# Patient Record
Sex: Male | Born: 1960 | Race: White | Hispanic: No | Marital: Single | Smoking: Never smoker
Health system: Southern US, Community
[De-identification: ages and names within clinical notes are randomized; demographics above are authoritative.]

## PROBLEM LIST (undated history)

## (undated) HISTORY — PX: TONSILLECTOMY: SUR1361

## (undated) HISTORY — PX: ANKLE SURGERY: SHX546

## (undated) HISTORY — PX: KNEE SURGERY: SHX244

---

## 2017-09-09 ENCOUNTER — Emergency Department (HOSPITAL_BASED_OUTPATIENT_CLINIC_OR_DEPARTMENT_OTHER)
Admission: EM | Admit: 2017-09-09 | Discharge: 2017-09-09 | Disposition: A | Discharge status: Home / Self Care | Payer: Non-veteran care | Attending: Emergency Medicine | Admitting: Emergency Medicine

## 2017-09-09 ENCOUNTER — Encounter (HOSPITAL_BASED_OUTPATIENT_CLINIC_OR_DEPARTMENT_OTHER): Payer: Self-pay | Admitting: *Deleted

## 2017-09-09 ENCOUNTER — Other Ambulatory Visit: Payer: Self-pay

## 2017-09-09 DIAGNOSIS — S0181XA Laceration without foreign body of other part of head, initial encounter: Secondary | ICD-10-CM | POA: Diagnosis present

## 2017-09-09 DIAGNOSIS — Y9389 Activity, other specified: Secondary | ICD-10-CM | POA: Diagnosis not present

## 2017-09-09 DIAGNOSIS — Y998 Other external cause status: Secondary | ICD-10-CM | POA: Diagnosis not present

## 2017-09-09 DIAGNOSIS — Y929 Unspecified place or not applicable: Secondary | ICD-10-CM | POA: Diagnosis not present

## 2017-09-09 DIAGNOSIS — Z23 Encounter for immunization: Secondary | ICD-10-CM | POA: Insufficient documentation

## 2017-09-09 DIAGNOSIS — W268XXA Contact with other sharp object(s), not elsewhere classified, initial encounter: Secondary | ICD-10-CM | POA: Diagnosis not present

## 2017-09-09 MED ORDER — LIDOCAINE HCL (PF) 1 % IJ SOLN
10.0000 mL | Freq: Once | INTRAMUSCULAR | Status: DC
  Filled 2017-09-09: qty 10

## 2017-09-09 MED ORDER — TETANUS-DIPHTH-ACELL PERTUSSIS 5-2.5-18.5 LF-MCG/0.5 IM SUSP
0.5000 mL | Freq: Once | INTRAMUSCULAR | Status: AC
Start: 2017-09-09 — End: 2017-09-09
  Administered 2017-09-09: 0.5 mL via INTRAMUSCULAR
  Filled 2017-09-09: qty 0.5

## 2017-09-09 MED ORDER — HYDROCODONE-ACETAMINOPHEN 5-325 MG PO TABS: 1.0000 | ORAL_TABLET | Freq: Four times a day (QID) | ORAL | 0 refills | Status: AC | PRN

## 2017-09-09 MED ORDER — BACITRACIN ZINC 500 UNIT/GM EX OINT
TOPICAL_OINTMENT | Freq: Once | CUTANEOUS | Status: AC
  Administered 2017-09-09: 23:00:00 via TOPICAL

## 2017-09-09 NOTE — Discharge Instructions (Signed)
Keep the wound clean and dry for the first 24 hours. After that you may gently clean the wound with soap and water. Make sure to pat dry the wound before covering it with any dressing. You can use topical antibiotic ointment and bandage. Ice and elevate for pain relief.   You can take Tylenol or Ibuprofen as directed for pain. You can alternate Tylenol and Ibuprofen every 4 hours for additional pain relief. You can take pain medication for severe or breakthrough.   Return to the Emergency Department, your primary care doctor, or the [facility] in 5-7 days for suture removal.   Monitor closely for any signs of infection. Return to the Emergency Department for any worsening redness/swelling of the area that begins to spread, drainage from the site, worsening pain, fever or any other worsening or concerning symptoms.

## 2017-09-09 NOTE — ED Triage Notes (Signed)
Laceration to the left side of his face near his eye. He turned and hit his face on his rear view mirror. Bleeding controlled.

## 2017-09-09 NOTE — ED Notes (Signed)
1 inch lac to left side of face on upper cheek bone from a broken mirror. Minimal bleeding noted.

## 2017-09-09 NOTE — ED Provider Notes (Signed)
[facility] EMERGENCY DEPARTMENT Provider Note   CSN: [id number] Arrival date & time: 09/09/17  2019     History   Chief Complaint Chief Complaint  Patient presents with  . Laceration    HPI Frank Cobb is a 57 y.o. male who presents for evaluation of left-sided facial injury that occurred this evening.  Patient reports that he was bending down to pick something up in his car door and when he stood up, he hit the left side of his face on the car window.  He states that there was a sharp edge that caused a laceration.  Patient reports that bleeding is controlled on ED arrival.  He states he did not have any LOC or head injury.  Patient states he thinks his Tdap is within 5 years but is not sure.  The history is provided by the patient.    History reviewed. No pertinent past medical history.  There are no active problems to display for this patient.   Past Surgical History:  Procedure Laterality Date  . ANKLE SURGERY    . KNEE SURGERY    . TONSILLECTOMY          Home Medications    Prior to Admission medications   Medication Sig Start Date End Date Taking? Authorizing Provider  HYDROcodone-acetaminophen (NORCO/VICODIN) 5-325 MG tablet Take 1-2 tablets by mouth every 6 (six) hours as needed. 09/09/17   Maxwell Caul, PA-C    Family History No family history on file.  Social History Social History   Tobacco Use  . Smoking status: Never Smoker  . Smokeless tobacco: Never Used  Substance Use Topics  . Alcohol use: Never    Frequency: Never  . Drug use: Never     Allergies   Patient has no known allergies.   Review of Systems Review of Systems  Skin: Positive for wound.     Physical Exam Updated Vital Signs BP 122/83   Pulse 89   Temp 98.1 F (36.7 C) (Oral)   Resp 18   Ht 5\' 6"  (1.676 m)   Wt 70.3 kg (155 lb)   SpO2 99%   BMI 25.02 kg/m   Physical Exam  Constitutional: He appears well-developed and well-nourished.  HENT:    Head: Normocephalic and atraumatic.    Eyes: Conjunctivae and EOM are normal. Right eye exhibits no discharge. Left eye exhibits no discharge. No scleral icterus.  Pulmonary/Chest: Effort normal.  Neurological: He is alert.  Skin: Skin is warm and dry.  Psychiatric: He has a normal mood and affect. His speech is normal and behavior is normal.  Nursing note and vitals reviewed.    ED Treatments / Results  Labs (all labs ordered are listed, but only abnormal results are displayed) Labs Reviewed - No data to display  EKG None  Radiology No results found.  Procedures .Marland KitchenLaceration Repair Date/Time: 09/09/2017 11:07 PM Performed by: Maxwell Caul, PA-C Authorized by: Maxwell Caul, PA-C   Consent:    Consent obtained:  Verbal   Consent given by:  Patient   Risks discussed:  Infection, pain, retained foreign body and poor cosmetic result Anesthesia (see MAR for exact dosages):    Anesthesia method:  Local infiltration   Local anesthetic:  Lidocaine 2% WITH epi Laceration details:    Location:  Face   Length (cm):  2.5 Repair type:    Repair type:  Simple Exploration:    Wound exploration: wound explored through full range of motion  Wound extent: no foreign bodies/material noted     Contaminated: no   Treatment:    Area cleansed with:  Betadine and saline   Amount of cleaning:  Extensive   Irrigation solution:  Sterile saline   Irrigation method:  Syringe   Visualized foreign bodies/material removed: no   Skin repair:    Repair method:  Sutures   Suture size:  6-0   Suture material:  Prolene   Suture technique:  Simple interrupted   Number of sutures:  7 Approximation:    Approximation:  Close Post-procedure details:    Dressing:  Antibiotic ointment and sterile dressing Comments:     Once the wound was properly anesthetized, it was explored with full range of motion.  The wound appeared deeper than initially thought.  Given the extensiveness,  sutures were chosen.  The wound was thoroughly and excessively irrigated with sterile saline.  Wound repaired as documented above.   (including critical care time)  Medications Ordered in ED Medications  bacitracin ointment (has no administration in time range)  Tdap (BOOSTRIX) injection 0.5 mL (0.5 mLs Intramuscular Given 09/09/17 2211)     Initial Impression / Assessment and Plan / ED Course  I have reviewed the triage vital signs and the nursing notes.  Pertinent labs & imaging results that were available during my care of the patient were reviewed by me and considered in my medical decision making (see chart for details).     57 year old male who presents for left-sided facial laceration that occurred this evening.  Hit the left side of face on a sharp mirror.  No LOC, head injury.  Unsure of Tdap status. Patient is afebrile, non-toxic appearing, sitting comfortably on examination table. Vital signs reviewed and stable.  On exam, patient is a 1 inch linear laceration to the left side of face.  On further evaluation, the wound appears deeper than initially evaluation.  Given extensiveness, will plan for sutures instead of glue.  Laceration repaired as documented above.  Patient tolerated procedure well.  Wound care instructions discussed with patient. Patient had ample opportunity for questions and discussion. All patient's questions were answered with full understanding. Strict return precautions discussed. Patient expresses understanding and agreement to plan.   Final Clinical Impressions(s) / ED Diagnoses   Final diagnoses:  Facial laceration, initial encounter    ED Discharge Orders        Ordered    HYDROcodone-acetaminophen (NORCO/VICODIN) 5-325 MG tablet  Every 6 hours PRN     09/09/17 2306       Maxwell CaulLayden, Anaalicia Reimann A, PA-C 09/09/17 2310    Rolland PorterJames, Mark, MD 09/16/17 0005

## 2017-09-17 ENCOUNTER — Encounter (HOSPITAL_BASED_OUTPATIENT_CLINIC_OR_DEPARTMENT_OTHER): Payer: Self-pay

## 2017-09-17 ENCOUNTER — Emergency Department (HOSPITAL_BASED_OUTPATIENT_CLINIC_OR_DEPARTMENT_OTHER)
Admission: EM | Admit: 2017-09-17 | Discharge: 2017-09-17 | Disposition: A | Discharge status: Home / Self Care | Payer: Non-veteran care | Attending: Emergency Medicine | Admitting: Emergency Medicine

## 2017-09-17 ENCOUNTER — Other Ambulatory Visit: Payer: Self-pay

## 2017-09-17 DIAGNOSIS — Z4802 Encounter for removal of sutures: Secondary | ICD-10-CM | POA: Diagnosis present

## 2017-09-17 NOTE — ED Provider Notes (Signed)
[facility] EMERGENCY DEPARTMENT Provider Note   CSN: [id number] Arrival date & time: 09/17/17  1836     History   Chief Complaint Chief Complaint  Patient presents with  . Suture / Staple Removal    HPI Frank Cobb is a 57 y.o. male.  HPI  Patient is a [age] year old male with no significant past medical history presenting for suture removal.  Patient reports that on March 26, he scraped his head on the side of a broken mirror on his truck, and obtain laceration.  Patient denies any drainage or erythema from the sutured site over this past week.  History reviewed. No pertinent past medical history.  There are no active problems to display for this patient.   Past Surgical History:  Procedure Laterality Date  . ANKLE SURGERY    . KNEE SURGERY    . TONSILLECTOMY          Home Medications    Prior to Admission medications   Medication Sig Start Date End Date Taking? Authorizing Provider  HYDROcodone-acetaminophen (NORCO/VICODIN) 5-325 MG tablet Take 1-2 tablets by mouth every 6 (six) hours as needed. 09/09/17   Maxwell CaulLayden, Lindsey A, PA-C    Family History No family history on file.  Social History Social History   Tobacco Use  . Smoking status: Never Smoker  . Smokeless tobacco: Never Used  Substance Use Topics  . Alcohol use: Never    Frequency: Never  . Drug use: Never     Allergies   Decadron [dexamethasone]   Review of Systems Review of Systems  Skin: Negative for color change and wound.  Neurological: Negative for headaches.     Physical Exam Updated Vital Signs BP 103/67 (BP Location: Left Arm)   Pulse 84   Temp 98.3 F (36.8 C) (Oral)   Resp 16   Ht 5\' 6"  (1.676 m)   Wt 70.3 kg (155 lb)   SpO2 98%   BMI 25.02 kg/m   Physical Exam  Constitutional: He appears well-developed and well-nourished. No distress.  Sitting comfortably in bed.  HENT:  Head: Normocephalic and atraumatic.  Eyes: Conjunctivae are normal. Right eye  exhibits no discharge. Left eye exhibits no discharge.  EOMs normal to gross examination.  Neck: Normal range of motion.  Cardiovascular: Normal rate and regular rhythm.  Intact, 2+ radial pulse.  Pulmonary/Chest:  Normal respiratory effort. Patient converses comfortably. No audible wheeze or stridor.  Abdominal: He exhibits no distension.  Musculoskeletal: Normal range of motion.  Neurological: He is alert.  Cranial nerves intact to gross observation. Patient moves extremities without difficulty.  Skin: Skin is warm and dry. He is not diaphoretic.  Patient exhibits a well-healed laceration of the left temple.  No erythema.  No hypertrophy of skin.  Psychiatric: He has a normal mood and affect. His behavior is normal. Judgment and thought content normal.  Nursing note and vitals reviewed.    ED Treatments / Results  Labs (all labs ordered are listed, but only abnormal results are displayed) Labs Reviewed - No data to display  EKG None  Radiology No results found.  Procedures Procedures (including critical care time)  Medications Ordered in ED Medications - No data to display   Initial Impression / Assessment and Plan / ED Course  I have reviewed the triage vital signs and the nursing notes.  Pertinent labs & imaging results that were available during my care of the patient were reviewed by me and considered in my medical decision making (  see chart for details).     Patient well-appearing in no acute distress.  7 Prolene sutures removed from the face today.  Laceration is well-healed.  Patient instructed to return for any new or worsening symptoms.  Final Clinical Impressions(s) / ED Diagnoses   Final diagnoses:  Visit for suture removal    ED Discharge Orders    None       Delia Chimes 09/17/17 2044    Melene Plan, DO 09/17/17 2110

## 2017-09-17 NOTE — Discharge Instructions (Signed)
Please see the information and instructions below regarding your visit.  Your diagnoses today include:  1. Visit for suture removal     Tests performed today include: See side panel of your discharge paperwork for testing performed today. Vital signs are listed at the bottom of these instructions.   Medications prescribed:    Take any prescribed medications only as prescribed, and any over the counter medications only as directed on the packaging.  Home care instructions:  Please follow any educational materials contained in this packet.   You may continue to keep the area covered if there will be dirt in it.  Follow-up instructions: Please follow-up with your primary care provider as needed for further evaluation of your symptoms if they are not completely improved.    Return instructions:  Please return to the Emergency Department if you experience worsening symptoms.  Please return if you have any other emergent concerns.  Additional Information:   Your vital signs today were: BP 103/67 (BP Location: Left Arm)    Pulse 84    Temp 98.3 F (36.8 C) (Oral)    Resp 16    Ht 5\' 6"  (1.676 m)    Wt 70.3 kg (155 lb)    SpO2 98%    BMI 25.02 kg/m  If your blood pressure (BP) was elevated on multiple readings during this visit above 130 for the top number or above 80 for the bottom number, please have this repeated by your primary care provider within one month. --------------  Thank you for allowing us to participate in your care today.

## 2017-09-17 NOTE — ED Triage Notes (Signed)
Pt here to get stitches removed from left side of face

## 2017-10-30 ENCOUNTER — Emergency Department (HOSPITAL_BASED_OUTPATIENT_CLINIC_OR_DEPARTMENT_OTHER)
Admission: EM | Admit: 2017-10-30 | Discharge: 2017-10-30 | Disposition: A | Discharge status: Home / Self Care | Payer: Non-veteran care | Attending: Physician Assistant | Admitting: Physician Assistant

## 2017-10-30 ENCOUNTER — Other Ambulatory Visit: Payer: Self-pay

## 2017-10-30 ENCOUNTER — Encounter (HOSPITAL_BASED_OUTPATIENT_CLINIC_OR_DEPARTMENT_OTHER): Payer: Self-pay | Admitting: Adult Health

## 2017-10-30 ENCOUNTER — Emergency Department (HOSPITAL_BASED_OUTPATIENT_CLINIC_OR_DEPARTMENT_OTHER): Payer: Non-veteran care

## 2017-10-30 DIAGNOSIS — S61210A Laceration without foreign body of right index finger without damage to nail, initial encounter: Secondary | ICD-10-CM | POA: Insufficient documentation

## 2017-10-30 DIAGNOSIS — S6981XA Other specified injuries of right wrist, hand and finger(s), initial encounter: Secondary | ICD-10-CM | POA: Diagnosis present

## 2017-10-30 DIAGNOSIS — W312XXA Contact with powered woodworking and forming machines, initial encounter: Secondary | ICD-10-CM | POA: Diagnosis not present

## 2017-10-30 DIAGNOSIS — Y999 Unspecified external cause status: Secondary | ICD-10-CM | POA: Insufficient documentation

## 2017-10-30 DIAGNOSIS — Y929 Unspecified place or not applicable: Secondary | ICD-10-CM | POA: Diagnosis not present

## 2017-10-30 DIAGNOSIS — Y939 Activity, unspecified: Secondary | ICD-10-CM | POA: Insufficient documentation

## 2017-10-30 MED ORDER — CEPHALEXIN 500 MG PO CAPS: 500.0000 mg | ORAL_CAPSULE | Freq: Two times a day (BID) | ORAL | 0 refills | Status: AC

## 2017-10-30 MED ORDER — BACITRACIN ZINC 500 UNIT/GM EX OINT
TOPICAL_OINTMENT | Freq: Two times a day (BID) | CUTANEOUS | Status: DC
  Administered 2017-10-30: 1 via TOPICAL
  Filled 2017-10-30: qty 28.35

## 2017-10-30 NOTE — Discharge Instructions (Addendum)
Take antibiotics as prescribed.  Take the entire course, even if the symptoms improve. Keep the wound clean and covered.  Wash it daily with soap and water.  Reapply new dressing each day. Follow-up with your primary care doctor as needed for further concerns. Return to the emergency room if you develop high fevers, pus draining from the area, inability to move your finger, numbness, or any new or concerning symptoms.

## 2017-10-30 NOTE — ED Notes (Signed)
Right Index finger lac irrigated w/ sterile saline. Bleeding controlled. Pt soaking finger in iodine/sterile saline solution.

## 2017-10-30 NOTE — ED Provider Notes (Signed)
[facility] EMERGENCY DEPARTMENT Provider Note   CSN: [id number] Arrival date & time: 10/30/17  1939     History   Chief Complaint Chief Complaint  Patient presents with  . Laceration    HPI Frank Cobb is a 57 y.o. male presenting for evaluation of finger laceration.  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  He cleaned the laceration with peroxide and iodine, bandaged it, and an hour later repeated the same.  He is here to make sure that he did not break the bone.  He denies numbness or tingling.  He is able to move his finger.  His tetanus is up-to-date.  He is not immunocompromised.  Is not on blood thinners.  HPI  History reviewed. No pertinent past medical history.  There are no active problems to display for this patient.   Past Surgical History:  Procedure Laterality Date  . ANKLE SURGERY    . KNEE SURGERY    . TONSILLECTOMY          Home Medications    Prior to Admission medications   Medication Sig Start Date End Date Taking? Authorizing Provider  cephALEXin (KEFLEX) 500 MG capsule Take 1 capsule (500 mg total) by mouth 2 (two) times daily for 5 days. 10/30/17 11/04/17  Charlene Detter, PA-C  HYDROcodone-acetaminophen (NORCO/VICODIN) 5-325 MG tablet Take 1-2 tablets by mouth every 6 (six) hours as needed. 09/09/17   Maxwell Caul, PA-C    Family History History reviewed. No pertinent family history.  Social History Social History   Tobacco Use  . Smoking status: Never Smoker  . Smokeless tobacco: Never Used  Substance Use Topics  . Alcohol use: Never    Frequency: Never  . Drug use: Never     Allergies   Decadron [dexamethasone]   Review of Systems Review of Systems  Skin: Positive for wound.  Hematological: Does not bruise/bleed easily.     Physical Exam Updated Vital Signs BP 112/76 (BP Location: Left Arm)   Pulse 90   Temp 98.2 F  (36.8 C) (Oral)   Resp 16   SpO2 98%   Physical Exam  Constitutional: He is oriented to person, place, and time. He appears well-developed and well-nourished. No distress.  HENT:  Head: Normocephalic and atraumatic.  Eyes: EOM are normal.  Neck: Normal range of motion.  Pulmonary/Chest: Effort normal.  Abdominal: He exhibits no distension.  Musculoskeletal: Normal range of motion. He exhibits tenderness. He exhibits no edema.  Radial pulses intact bilaterally.  Sensation of all fingers intact.  Strength of index finger against resistance intact.  Good cap refill.  Neurological: He is alert and oriented to person, place, and time. No sensory deficit.  Skin: Skin is warm. No rash noted.  Jagged superficial laceration approximately 1 cm long of the distal aspect of the right index finger without active bleeding.  Psychiatric: He has a normal mood and affect.  Nursing note and vitals reviewed.    ED Treatments / Results  Labs (all labs ordered are listed, but only abnormal results are displayed) Labs Reviewed - No data to display  EKG None  Radiology Dg Finger Index Right  Result Date: 10/30/2017 CLINICAL DATA:  Right index finger laceration EXAM: RIGHT INDEX FINGER 2+V COMPARISON:  None. FINDINGS: No acute displaced fracture or malalignment. Dorsal soft tissue injury distal second digit. Mild degenerative changes at the DIP joint.  No radiopaque foreign body. IMPRESSION: No acute osseous abnormality. Electronically Signed   By: Jasmine Pang M.D.   On: 10/30/2017 20:52    Procedures Procedures (including critical care time)  Medications Ordered in ED Medications  bacitracin ointment (1 application Topical Given 10/30/17 2113)     Initial Impression / Assessment and Plan / ED Course  I have reviewed the triage vital signs and the nursing notes.  Pertinent labs & imaging results that were available during my care of the patient were reviewed by me and considered in my  medical decision making (see chart for details).     Pt presenting for evaluation of laceration on the dorsal aspect of the right index finger.  Physical exam shows patient is neurovascularly intact.  Concerned about bony involvement, will obtain x-ray.  X-ray reviewed and interpreted by me, no fracture or dislocation.  Discussed findings with patient.  As wound is very jagged, superficial, and without active bleeding, will apply bacitracin and nonstick dressing.  Discussed wound care.  Will discharge with antibiotics for prophylaxis.  At this time, patient appears safe for discharge.  Return precautions given.  Patient states he understands and agrees to plan.   Final Clinical Impressions(s) / ED Diagnoses   Final diagnoses:  Laceration of right index finger without foreign body without damage to nail, initial encounter    ED Discharge Orders        Ordered    cephALEXin (KEFLEX) 500 MG capsule  2 times daily     10/30/17 2058       Alveria Apley, PA-C 10/31/17 0002    Mackuen, Cindee Salt, MD 10/31/17 0007

## 2017-10-30 NOTE — ED Triage Notes (Signed)
Presents with LAceration to right pointer finger that occurred from a table saw today. Bleeding c9ontrolled.

## 2017-10-30 NOTE — ED Notes (Signed)
Suture cart at bedside 

## 2019-05-07 IMAGING — CR DG FINGER INDEX 2+V*R*
3 series · 3 of 3 positions shown · non-contrast
Comparison: None.

CLINICAL DATA: Right index finger laceration

EXAM:
RIGHT INDEX FINGER 2+V

[x finger pa right]
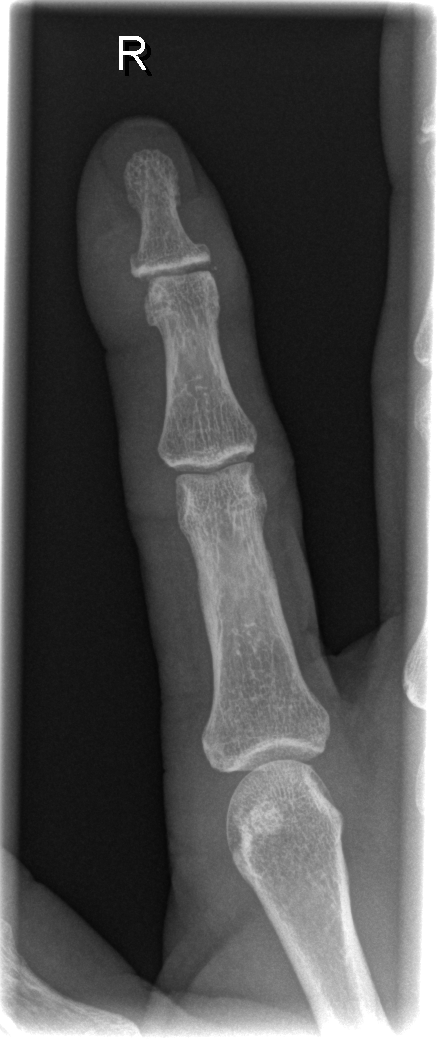

[x finger obl. right]
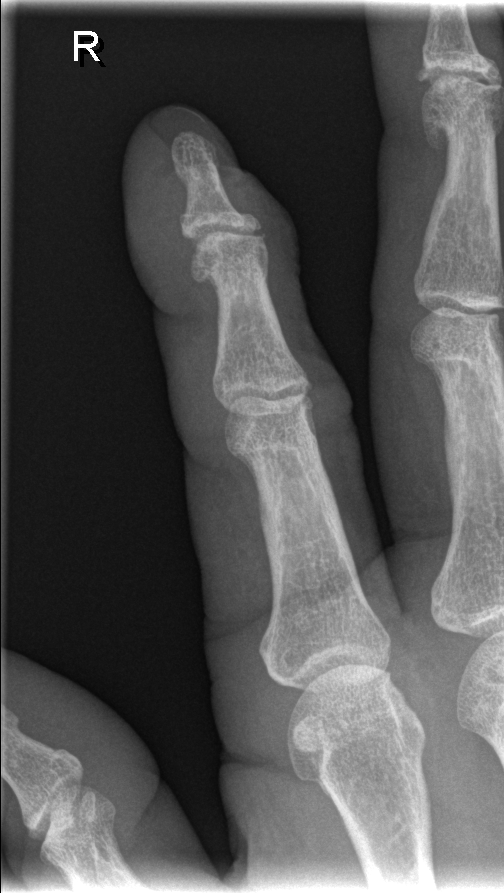

[x finger lateral right]
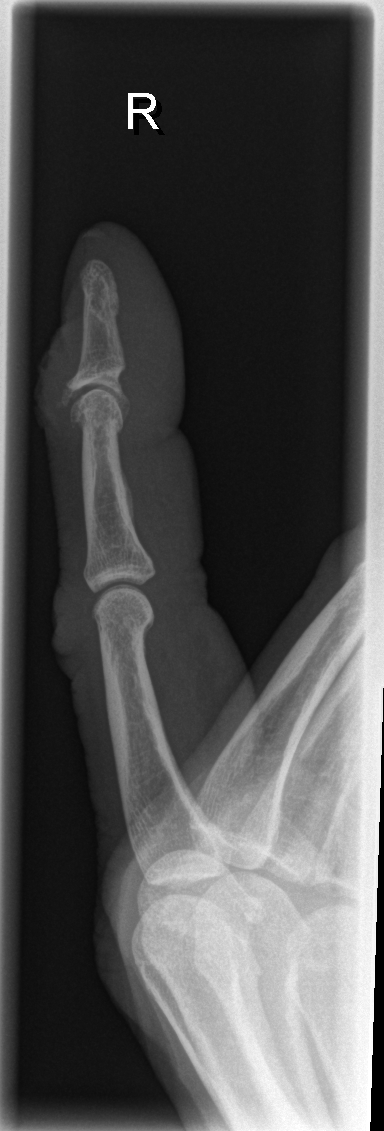

[3 of 3 positions shown; findings below may reference images not displayed]

FINDINGS: No acute displaced fracture or malalignment. Dorsal soft tissue
injury distal second digit. Mild degenerative changes at the DIP
joint. No radiopaque foreign body.
IMPRESSION: No acute osseous abnormality.
